# Patient Record
Sex: Male | Born: 2000 | Race: White | Hispanic: No | Marital: Single | Smoking: Never smoker
Health system: Southern US, Community
[De-identification: ages and names within clinical notes are randomized; demographics above are authoritative.]

## PROBLEM LIST (undated history)

## (undated) DIAGNOSIS — Z789 Other specified health status: Secondary | ICD-10-CM

---
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
## 2024-03-12 ENCOUNTER — Other Ambulatory Visit: Payer: Self-pay | Admitting: Sports Medicine

## 2024-03-12 DIAGNOSIS — M25511 Pain in right shoulder: Secondary | ICD-10-CM

## 2024-03-19 ENCOUNTER — Encounter: Payer: Self-pay | Admitting: Sports Medicine

## 2024-03-22 ENCOUNTER — Inpatient Hospital Stay
Admission: RE | Admit: 2024-03-22 | Discharge: 2024-03-22 | Discharge status: Home / Self Care | Source: Ambulatory Visit | Attending: Sports Medicine | Admitting: Sports Medicine

## 2024-03-22 DIAGNOSIS — M25511 Pain in right shoulder: Secondary | ICD-10-CM
